# Patient Record
Sex: Male | Born: 1980 | Hispanic: No | Marital: Single
Health system: Southern US, Community
[De-identification: ages and names within clinical notes are randomized; demographics above are authoritative.]

---

## 2008-11-10 ENCOUNTER — Encounter: Admission: RE | Admit: 2008-11-10 | Discharge: 2008-11-10 | Payer: Self-pay | Admitting: Internal Medicine

## 2010-09-13 IMAGING — US US SOFT TISSUE HEAD/NECK
1 series · 14 of 25 positions shown · non-contrast
Comparison: None

CLINICAL DATA: Goiter

THYROID ULTRASOUND
TECHNIQUE: Ultrasound examination of the thyroid gland and
adjacent soft tissues was performed.

[Series 1: us soft tissue head/neck · 0.04mm/px · 14 of 34 slices shown]
[im 1/34]
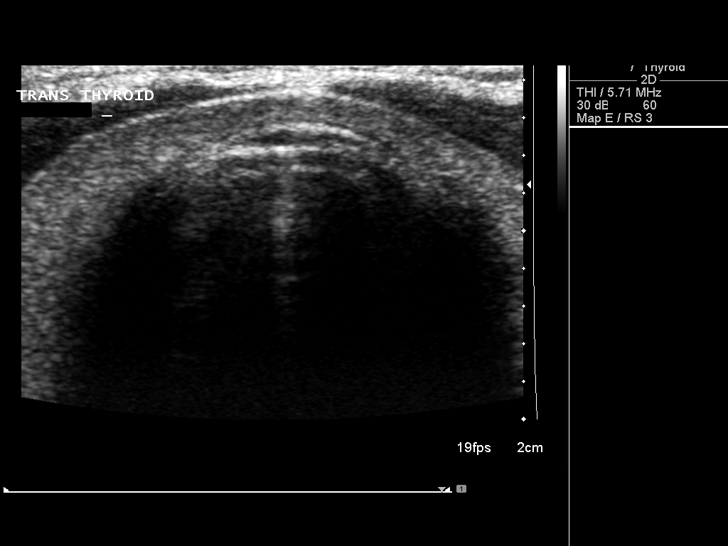
[im 3/34]
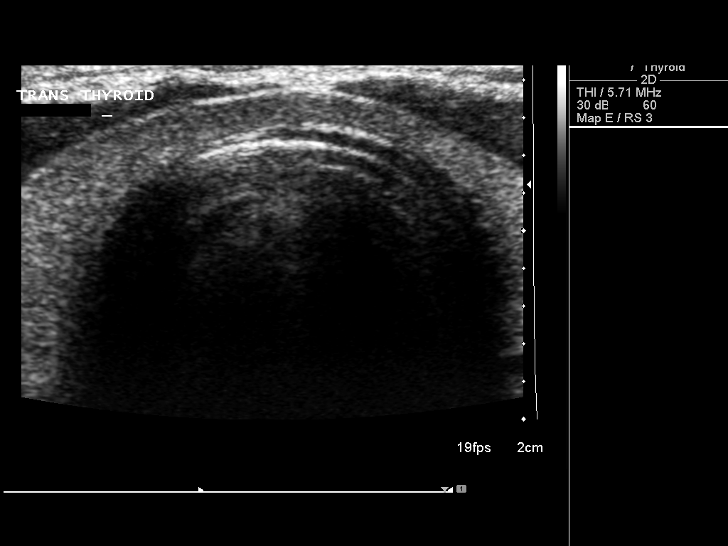
[im 6/34]
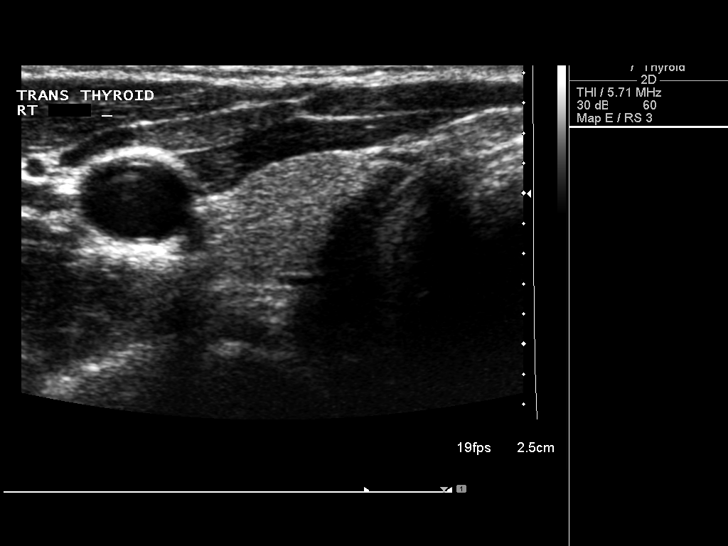
[im 9/34]
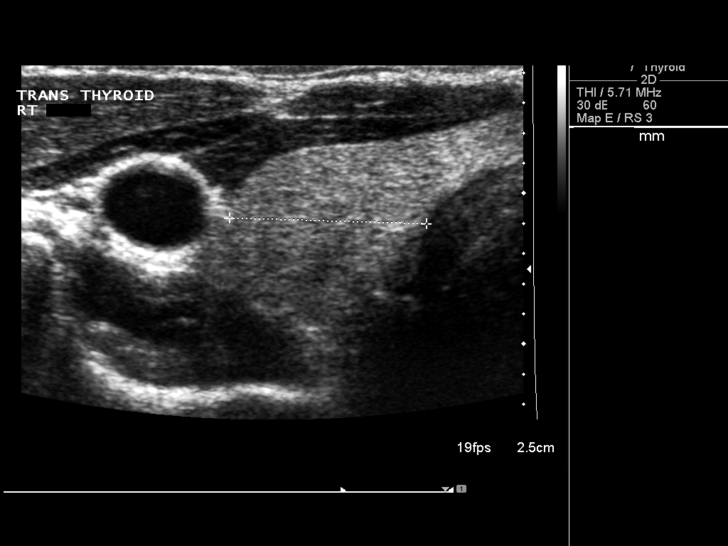
[im 12/34]
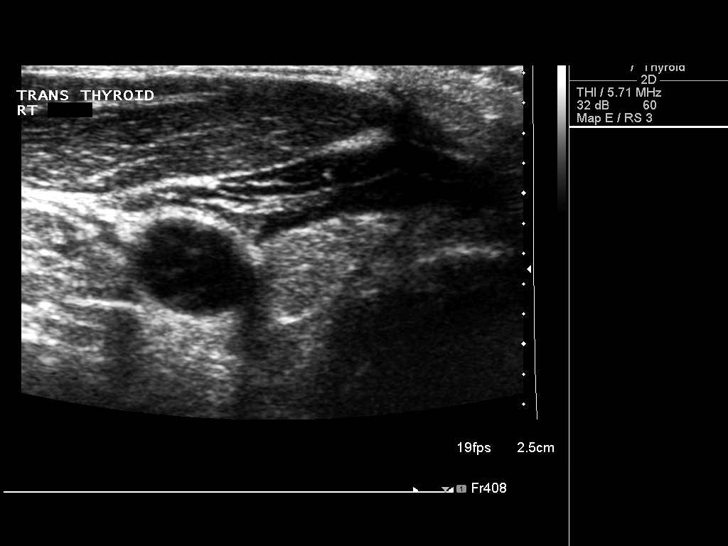
[im 13/34]
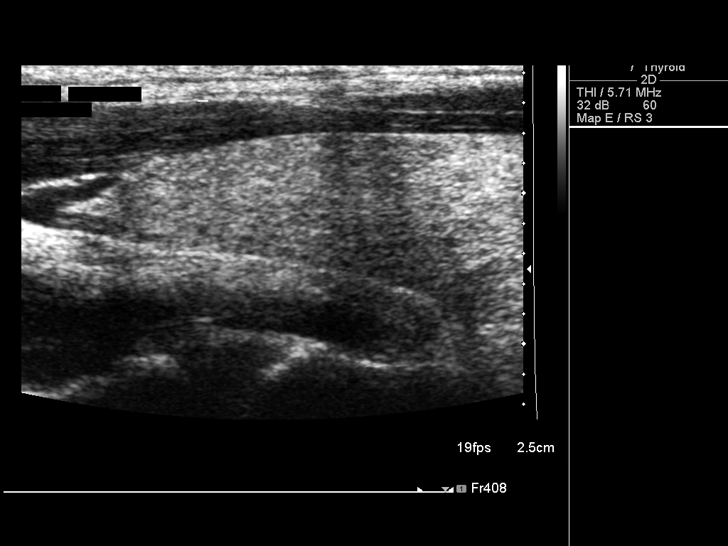
[im 16/34]
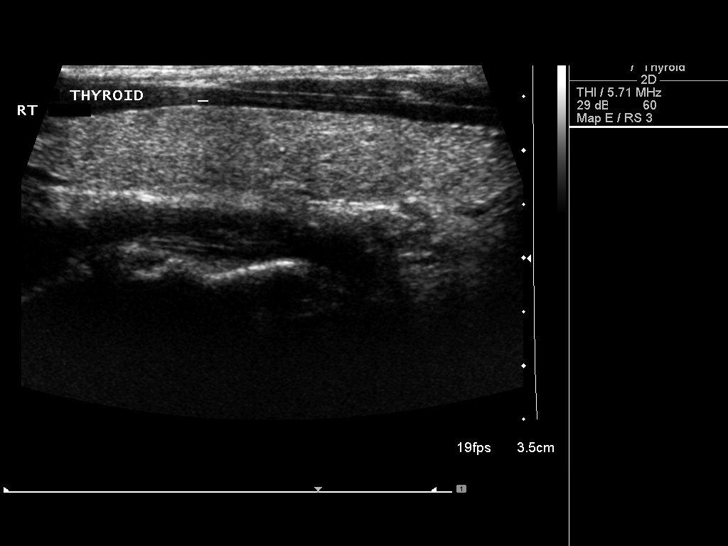
[im 18/34]
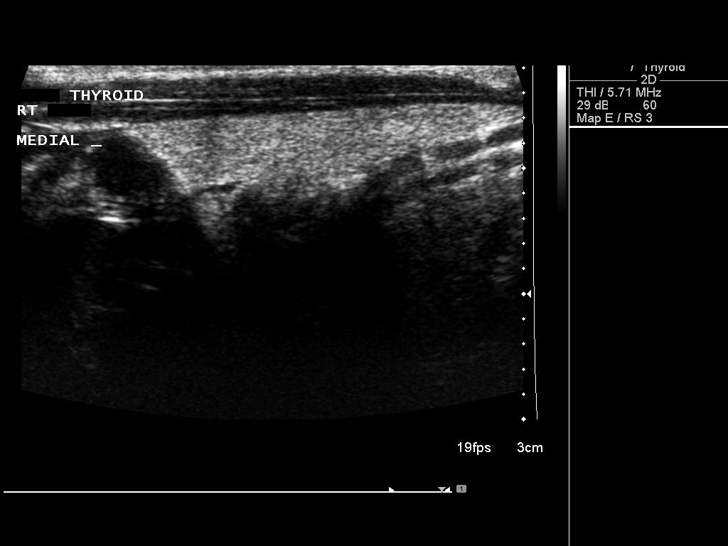
[im 21/34]
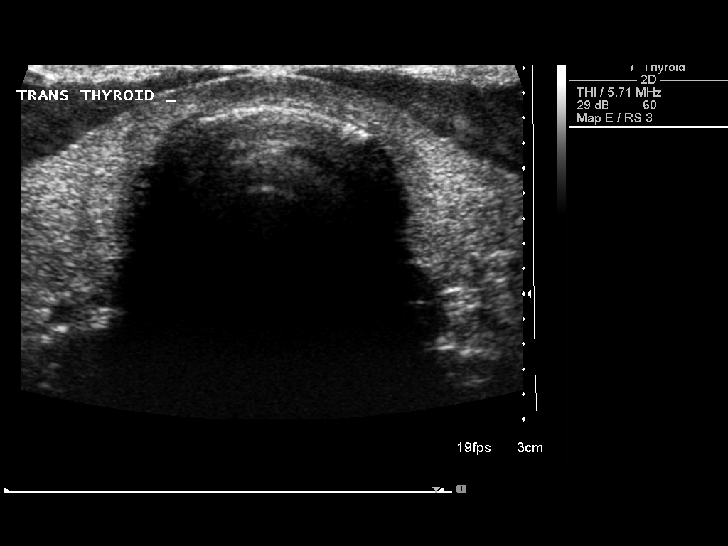
[im 23/34]
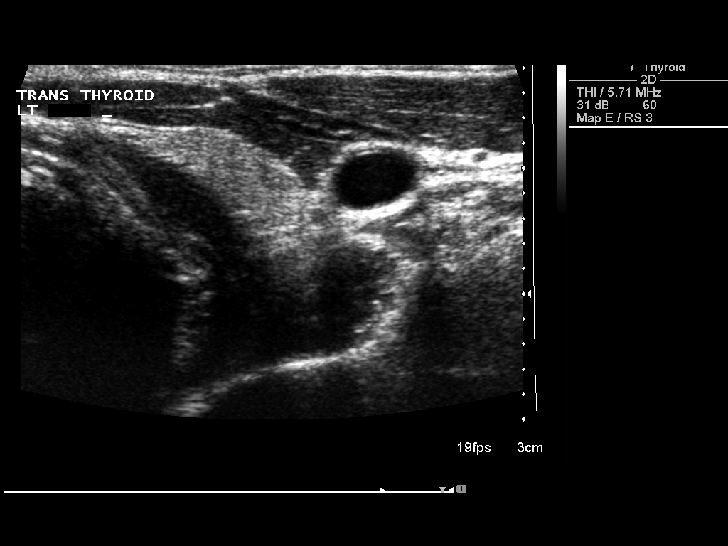
[im 25/34]
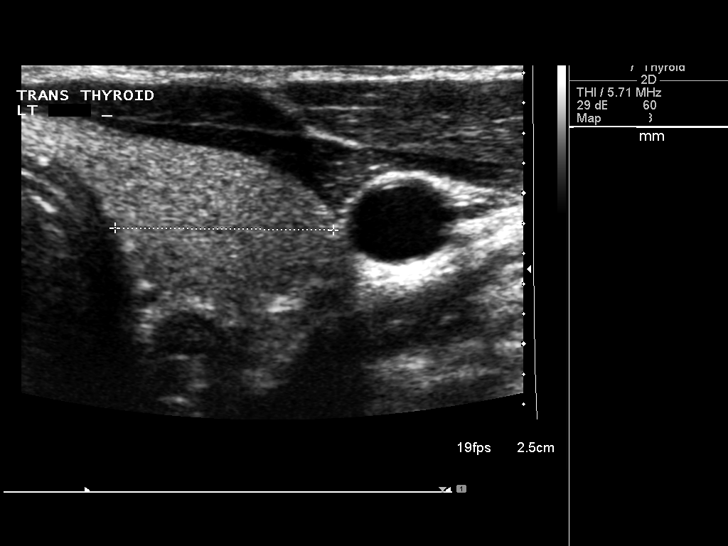
[im 28/34]
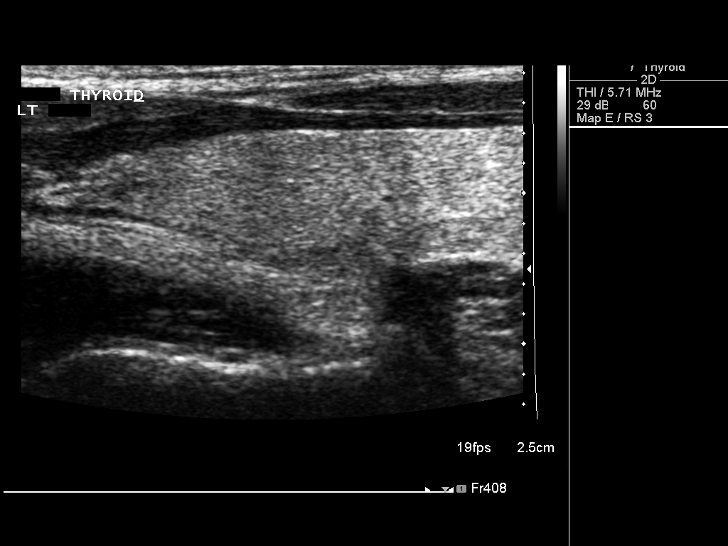
[im 31/34]
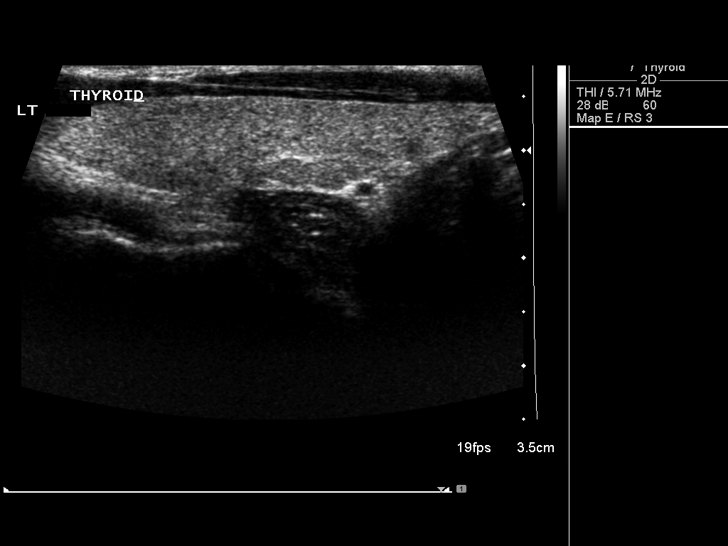
[im 34/34]
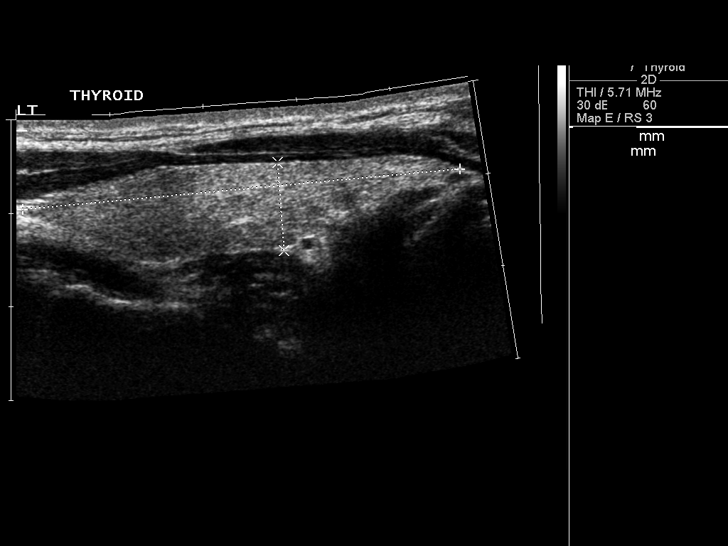

[14 of 25 positions shown; findings below may reference images not displayed]

FINDINGS: Right thyroid lobe:  4.6 x 1.3 x 1.2 cm

Left thyroid lobe:  4.7 x 1.5 x 1.1 cm

Isthmus:  0.2 cm

The thyroid is homogeneous in echotexture without focal mass
lesion.
IMPRESSION: Normal exam.  No focal abnormality.

## 2016-07-08 ENCOUNTER — Telehealth: Payer: Self-pay | Admitting: *Deleted

## 2016-07-08 NOTE — Telephone Encounter (Signed)
Patient called stating she wasn't happy with the way her foot turned out and thinks it needs to be xrayed  Referred to schedulers for appt
